# Patient Record
Sex: Female | Born: 1980 | Race: White | Hispanic: No | Marital: Single | Smoking: Light tobacco smoker
Health system: Southern US, Community
[De-identification: ages and names within clinical notes are randomized; demographics above are authoritative.]

---
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
## 1999-04-05 ENCOUNTER — Emergency Department (HOSPITAL_COMMUNITY): Admission: EM | Admit: 1999-04-05 | Discharge: 1999-04-05 | Payer: Self-pay | Admitting: Emergency Medicine

## 2000-01-21 ENCOUNTER — Inpatient Hospital Stay (HOSPITAL_COMMUNITY): Admission: AD | Admit: 2000-01-21 | Discharge: 2000-01-21 | Payer: Self-pay | Admitting: Obstetrics

## 2003-04-08 ENCOUNTER — Other Ambulatory Visit: Admission: RE | Admit: 2003-04-08 | Discharge: 2003-04-08 | Payer: Self-pay | Admitting: Obstetrics and Gynecology

## 2003-05-04 ENCOUNTER — Emergency Department (HOSPITAL_COMMUNITY): Admission: EM | Admit: 2003-05-04 | Discharge: 2003-05-04 | Payer: Self-pay

## 2003-10-08 ENCOUNTER — Emergency Department (HOSPITAL_COMMUNITY): Admission: EM | Admit: 2003-10-08 | Discharge: 2003-10-09 | Payer: Self-pay | Admitting: Emergency Medicine

## 2004-03-01 ENCOUNTER — Other Ambulatory Visit: Admission: RE | Admit: 2004-03-01 | Discharge: 2004-03-01 | Payer: Self-pay | Admitting: Obstetrics and Gynecology

## 2004-03-17 ENCOUNTER — Emergency Department (HOSPITAL_COMMUNITY): Admission: EM | Admit: 2004-03-17 | Discharge: 2004-03-17 | Payer: Self-pay | Admitting: Emergency Medicine

## 2004-12-20 ENCOUNTER — Inpatient Hospital Stay (HOSPITAL_COMMUNITY): Admission: AD | Admit: 2004-12-20 | Discharge: 2004-12-20 | Payer: Self-pay | Admitting: Obstetrics and Gynecology

## 2005-01-31 ENCOUNTER — Inpatient Hospital Stay (HOSPITAL_COMMUNITY): Admission: AD | Admit: 2005-01-31 | Discharge: 2005-02-02 | Payer: Self-pay | Admitting: Obstetrics and Gynecology

## 2005-05-10 ENCOUNTER — Other Ambulatory Visit: Admission: RE | Admit: 2005-05-10 | Discharge: 2005-05-10 | Payer: Self-pay | Admitting: Obstetrics and Gynecology
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
## 2011-02-21 ENCOUNTER — Emergency Department (HOSPITAL_COMMUNITY)
Admission: EM | Admit: 2011-02-21 | Discharge: 2011-02-21 | Disposition: A | Discharge status: Home / Self Care | Payer: Self-pay | Attending: Emergency Medicine | Admitting: Emergency Medicine

## 2011-02-21 DIAGNOSIS — T23039A Burn of unspecified degree of unspecified multiple fingers (nail), not including thumb, initial encounter: Secondary | ICD-10-CM | POA: Insufficient documentation

## 2011-02-21 DIAGNOSIS — X12XXXA Contact with other hot fluids, initial encounter: Secondary | ICD-10-CM | POA: Insufficient documentation

## 2020-05-18 ENCOUNTER — Ambulatory Visit (HOSPITAL_COMMUNITY)
Admission: EM | Admit: 2020-05-18 | Discharge: 2020-05-18 | Disposition: A | Discharge status: Home / Self Care | Payer: Self-pay

## 2020-05-18 ENCOUNTER — Encounter (HOSPITAL_COMMUNITY): Payer: Self-pay

## 2020-05-18 ENCOUNTER — Other Ambulatory Visit: Payer: Self-pay

## 2020-05-18 DIAGNOSIS — S61411A Laceration without foreign body of right hand, initial encounter: Secondary | ICD-10-CM

## 2020-05-18 DIAGNOSIS — M79641 Pain in right hand: Secondary | ICD-10-CM

## 2020-05-18 NOTE — ED Provider Notes (Signed)
  [facility]   MRN: [id number] DOB: [birth year]  Subjective:   Sabrina Soto is a 39 y.o. female presenting for suffering a laceration about an hour ago. Suffered this from a razor wire fence. Has tdap updated.   No current facility-administered medications for this encounter. No current outpatient medications on file.   No Known Allergies  History reviewed. No pertinent past medical history.   History reviewed. No pertinent surgical history.  Family History  Family history unknown: Yes    Social History   Tobacco Use  . Smoking status: Light Tobacco Smoker  Substance Use Topics  . Alcohol use: Not on file  . Drug use: Not on file    ROS   Objective:   Vitals: BP 121/69 (BP Location: Right Arm)   Pulse 90   Temp 98.1 F (36.7 C) (Oral)   Resp 18   LMP 04/29/2020   SpO2 100%   Physical Exam Constitutional:      General: She is not in acute distress.    Appearance: Normal appearance. She is well-developed. She is not ill-appearing, toxic-appearing or diaphoretic.  HENT:     Head: Normocephalic and atraumatic.     Nose: Nose normal.     Mouth/Throat:     Mouth: Mucous membranes are moist.     Pharynx: Oropharynx is clear.  Eyes:     General: No scleral icterus.       Right eye: No discharge.        Left eye: No discharge.     Extraocular Movements: Extraocular movements intact.     Conjunctiva/sclera: Conjunctivae normal.     Pupils: Pupils are equal, round, and reactive to light.  Cardiovascular:     Rate and Rhythm: Normal rate.  Pulmonary:     Effort: Pulmonary effort is normal.  Musculoskeletal:       Hands:  Skin:    General: Skin is warm and dry.  Neurological:     General: No focal deficit present.     Mental Status: She is alert and oriented to person, place, and time.  Psychiatric:        Mood and Affect: Mood normal.        Behavior: Behavior normal.        Thought Content: Thought content normal.        Judgment: Judgment  normal.    PROCEDURE NOTE: laceration repair Verbal consent obtained from patient.  Local anesthesia with 2cc Lidocaine 1% with epinephrine.  Wound explored for tendon, ligament damage. Wound scrubbed with soap and water and rinsed. Wound closed with #3 4-0 Prolene (2 simple interrupted, 1 horizontal mattress) sutures.  Wound cleansed and dressed.   Assessment and Plan :   PDMP not reviewed this encounter.  1. Right hand pain   2. Laceration of right hand without foreign body, initial encounter     Laceration repaired successfully.  No need for obtaining a Tdap today discussed wound care, signs of infection.  Use Tylenol or ibuprofen for pain relief.  Follow-up in 10 days for suture removal. Counseled patient on potential for adverse effects with medications prescribed/recommended today, ER and return-to-clinic precautions discussed, patient verbalized understanding.    Wallis Bamberg, PA-C 05/18/20 [id number]

## 2020-05-18 NOTE — Discharge Instructions (Signed)

## 2020-05-18 NOTE — ED Triage Notes (Signed)
Pt presents with laceration to top of right hand from slamming it against a razor wire fence when she trying to open it this morning.  Pt states she is up to date on tetanus vaccine.

## 2020-06-02 ENCOUNTER — Ambulatory Visit (HOSPITAL_COMMUNITY)
Admission: EM | Admit: 2020-06-02 | Discharge: 2020-06-02 | Disposition: A | Discharge status: Home / Self Care | Payer: Self-pay

## 2020-06-02 ENCOUNTER — Other Ambulatory Visit: Payer: Self-pay

## 2020-06-02 DIAGNOSIS — Z4802 Encounter for removal of sutures: Secondary | ICD-10-CM

## 2020-06-02 NOTE — ED Triage Notes (Signed)
Pt presents to have 3 sutures removed from top of right hand.

## 2022-01-12 DIAGNOSIS — K047 Periapical abscess without sinus: Secondary | ICD-10-CM | POA: Diagnosis not present
